# Patient Record
Sex: Female | Born: 1975 | Race: White | Hispanic: No | Marital: Married | Smoking: Current every day smoker
Health system: Southern US, Community
[De-identification: ages and names within clinical notes are randomized; demographics above are authoritative.]

## PROBLEM LIST (undated history)

## (undated) DIAGNOSIS — I10 Essential (primary) hypertension: Secondary | ICD-10-CM

## (undated) DIAGNOSIS — F419 Anxiety disorder, unspecified: Secondary | ICD-10-CM

## (undated) HISTORY — PX: NO PAST SURGERIES: SHX2092

---

## 2002-08-12 ENCOUNTER — Encounter: Admission: RE | Admit: 2002-08-12 | Discharge: 2002-09-11 | Payer: Self-pay | Admitting: Gynecology

## 2002-09-12 ENCOUNTER — Encounter: Admission: RE | Admit: 2002-09-12 | Discharge: 2002-10-12 | Payer: Self-pay | Admitting: Gynecology

## 2002-10-13 ENCOUNTER — Encounter: Admission: RE | Admit: 2002-10-13 | Discharge: 2002-11-12 | Payer: Self-pay | Admitting: Gynecology

## 2002-12-12 ENCOUNTER — Encounter: Admission: RE | Admit: 2002-12-12 | Discharge: 2003-01-11 | Payer: Self-pay | Admitting: Gynecology

## 2006-10-04 ENCOUNTER — Ambulatory Visit: Payer: Self-pay | Admitting: Obstetrics & Gynecology

## 2006-11-22 ENCOUNTER — Ambulatory Visit: Payer: Self-pay | Admitting: Obstetrics & Gynecology

## 2008-02-19 ENCOUNTER — Encounter (INDEPENDENT_AMBULATORY_CARE_PROVIDER_SITE_OTHER): Payer: Self-pay | Admitting: Gynecology

## 2008-02-19 ENCOUNTER — Ambulatory Visit: Payer: Self-pay | Admitting: Obstetrics and Gynecology

## 2011-01-18 NOTE — Assessment & Plan Note (Signed)
NAME:  Belinda Nelson, DALL NO.:  [id number]   MEDICAL RECORD NO.:  [id number]          PATIENT TYPE:  POB   LOCATION:  [facility] at [facility]         FACILITY:  [facility]   PHYSICIAN:  Argentina Donovan, MD        DATE OF BIRTH:  [birth year]   DATE OF SERVICE:  02/19/2008                                  CLINIC NOTE   HISTORY OF PRESENT ILLNESS:  The patient is a 35 year old Caucasian  female gravida 1, para 1-0-0-1, who has been on oral contraceptives for  the past 5 years and is considering getting pregnant.  She is in for her  annual examination and was counseled to wait 2 cycles after stopping the  pill and to start on her prenatal vitamin with folic acid prior to  conception.  The patient has had no physical complaints during the past  year.  She has been on oral contraceptive and lorazepam 0.5 mg b.i.d.  p.r.n. for history of anxiety.  She has no food allergies, but is  allergic to Cipro.   PAST SURGICAL HISTORY:  None.   PAST MEDICAL HISTORY:  None significantly except normal delivery.   FAMILY HISTORY:  Her grandmother, both of them had cancer along with  Hodgkin's disease.  She works part-time as a Associate Professor, and her  complaint last year of fatigue seems to have resolved as she thought  that was secondary to depression.   REVIEW OF SYSTEMS:  Negative with the exception of occasional need for  lorazepam.   PHYSICAL EXAMINATION:  VITAL SIGNS:  Blood pressure is 133/91, weight is  104 pounds, pulse is 94.  GENERAL:  The patient is a well-developed, well-nourished white female  in no acute distress.  HEENT:  Within normal limits.  NECK:  Supple.  Thyroid is symmetrical with no masses.  LUNGS:  Clear to auscultation and percussion.  HEART:  No murmur.  Normal sinus rhythm.  BREAST:  Symmetrical with no dominant masses.  No nipple discharge.  No  axillary, subclavicular or supraclavicular or submandibular lymph nodes  noted.  ABDOMEN:  Soft, flat, and nontender.   No masses, no organomegaly.  EXTREMITIES:  Normal with no edema.  No varices.  Her DTRs within normal  limits.  GENITAL:  External genitalia is normal with the exception of a small  nevus to the left of the labia majoris in the groin area.  BUS within  normal limits.  The vagina is clean and well rugated.  The cervix is  clean and parous.  Uterus is anterior, normal size, shape, consistency.  The adnexa is normal.  RECTAL:  No masses.   IMPRESSION:  Normal physical examination.  Borderline hypertension.  The  patient has recently had complete blood work done, which showed a  slightly elevated cholesterol level and she has been taking fish oil for  the past few months.           ______________________________  Argentina Donovan, MD     PR/MEDQ  D:  02/19/2008  T:  02/20/2008  Job:  [id number]

## 2011-07-10 ENCOUNTER — Ambulatory Visit: Payer: Self-pay | Admitting: Internal Medicine

## 2012-01-13 ENCOUNTER — Inpatient Hospital Stay: Payer: Self-pay

## 2012-01-13 LAB — PIH PROFILE
Anion Gap: 7 (ref 7–16)
Calcium, Total: 8.3 mg/dL — ABNORMAL LOW (ref 8.5–10.1)
Chloride: 105 mmol/L (ref 98–107)
Co2: 26 mmol/L (ref 21–32)
EGFR (African American): >60
EGFR (Non-African Amer.): >60
MCH: 30.9 pg (ref 26.0–34.0)
Osmolality: 277 (ref 275–301)
Platelet: 159 10*3/uL (ref 150–440)
Potassium: 3.2 mmol/L — ABNORMAL LOW (ref 3.5–5.1)
RBC: 3.52 10*6/uL — ABNORMAL LOW (ref 3.80–5.20)
RDW: 13.4 % (ref 11.5–14.5)
SGOT(AST): 19 U/L (ref 15–37)
Sodium: 138 mmol/L (ref 136–145)
Uric Acid: 2.9 mg/dL (ref 2.6–6.0)
WBC: 10.3 10*3/uL (ref 3.6–11.0)

## 2012-01-13 LAB — PROTEIN / CREATININE RATIO, URINE
Creatinine, Urine: 39.9 mg/dL (ref 30.0–125.0)
Protein, Random Urine: 6 mg/dL (ref 0–12)
Protein/Creat. Ratio: 150 mg/gCREAT (ref 0–200)

## 2012-03-04 ENCOUNTER — Observation Stay: Payer: Self-pay

## 2012-03-04 LAB — PIH PROFILE
Calcium, Total: 9.5 mg/dL (ref 8.5–10.1)
Co2: 27 mmol/L (ref 21–32)
Creatinine: 0.74 mg/dL (ref 0.60–1.30)
EGFR (Non-African Amer.): >60
Glucose: 90 mg/dL (ref 65–99)
HCT: 34.2 % — ABNORMAL LOW (ref 35.0–47.0)
HGB: 11.7 g/dL — ABNORMAL LOW (ref 12.0–16.0)
MCH: 31.1 pg (ref 26.0–34.0)
MCHC: 34.3 g/dL (ref 32.0–36.0)
MCV: 91 fL (ref 80–100)
Osmolality: 281 (ref 275–301)
RBC: 3.78 10*6/uL — ABNORMAL LOW (ref 3.80–5.20)
RDW: 13.3 % (ref 11.5–14.5)
SGOT(AST): 21 U/L (ref 15–37)

## 2012-03-04 LAB — PROTEIN / CREATININE RATIO, URINE
Creatinine, Urine: 14.7 mg/dL — ABNORMAL LOW (ref 30.0–125.0)
Protein, Random Urine: <5 mg/dL — ABNORMAL LOW (ref 0–12)

## 2012-03-14 ENCOUNTER — Observation Stay: Payer: Self-pay

## 2012-03-14 LAB — PIH PROFILE
Calcium, Total: 8.7 mg/dL (ref 8.5–10.1)
Creatinine: 0.73 mg/dL (ref 0.60–1.30)
EGFR (Non-African Amer.): >60
Glucose: 112 mg/dL — ABNORMAL HIGH (ref 65–99)
HCT: 33.9 % — ABNORMAL LOW (ref 35.0–47.0)
HGB: 11.4 g/dL — ABNORMAL LOW (ref 12.0–16.0)
MCV: 91 fL (ref 80–100)
Potassium: 3.1 mmol/L — ABNORMAL LOW (ref 3.5–5.1)
RDW: 13.4 % (ref 11.5–14.5)
SGOT(AST): 15 U/L (ref 15–37)
Sodium: 142 mmol/L (ref 136–145)
WBC: 10.1 10*3/uL (ref 3.6–11.0)

## 2012-03-14 LAB — PROTEIN / CREATININE RATIO, URINE: Protein, Random Urine: <5 mg/dL — ABNORMAL LOW (ref 0–12)

## 2012-04-03 ENCOUNTER — Observation Stay: Payer: Self-pay | Admitting: Obstetrics & Gynecology

## 2012-04-09 ENCOUNTER — Inpatient Hospital Stay: Payer: Self-pay

## 2012-04-09 LAB — PIH PROFILE
Anion Gap: 10 (ref 7–16)
BUN: 6 mg/dL — ABNORMAL LOW (ref 7–18)
Creatinine: 0.78 mg/dL (ref 0.60–1.30)
Glucose: 115 mg/dL — ABNORMAL HIGH (ref 65–99)
HCT: 33 % — ABNORMAL LOW (ref 35.0–47.0)
HGB: 11.4 g/dL — ABNORMAL LOW (ref 12.0–16.0)
MCV: 90 fL (ref 80–100)
Osmolality: 280 (ref 275–301)
Platelet: 137 10*3/uL — ABNORMAL LOW (ref 150–440)
Potassium: 2.9 mmol/L — ABNORMAL LOW (ref 3.5–5.1)
RBC: 3.69 10*6/uL — ABNORMAL LOW (ref 3.80–5.20)
WBC: 9.5 10*3/uL (ref 3.6–11.0)

## 2012-04-09 LAB — PROTEIN / CREATININE RATIO, URINE
Creatinine, Urine: 31.6 mg/dL (ref 30.0–125.0)
Protein, Random Urine: <5 mg/dL — ABNORMAL LOW (ref 0–12)

## 2012-04-10 LAB — PLATELET COUNT: Platelet: 138 10*3/uL — ABNORMAL LOW (ref 150–440)

## 2012-04-14 ENCOUNTER — Ambulatory Visit: Payer: Self-pay | Admitting: Pediatrics

## 2012-04-19 ENCOUNTER — Emergency Department: Payer: Self-pay | Admitting: Emergency Medicine

## 2012-04-19 LAB — CBC WITH DIFFERENTIAL/PLATELET
Eosinophil %: 1.9 %
HCT: 38.3 % (ref 35.0–47.0)
HGB: 13.4 g/dL (ref 12.0–16.0)
Monocyte %: 4.6 %
Neutrophil %: 64.4 %
RBC: 4.26 10*6/uL (ref 3.80–5.20)

## 2012-04-19 LAB — BASIC METABOLIC PANEL
Anion Gap: 8 (ref 7–16)
BUN: 14 mg/dL (ref 7–18)
EGFR (African American): >60
EGFR (Non-African Amer.): >60
Glucose: 96 mg/dL (ref 65–99)
Osmolality: 283 (ref 275–301)

## 2013-06-06 ENCOUNTER — Ambulatory Visit: Payer: Self-pay | Admitting: Family Medicine

## 2013-06-06 ENCOUNTER — Emergency Department: Payer: Self-pay | Admitting: Emergency Medicine

## 2013-06-06 LAB — COMPREHENSIVE METABOLIC PANEL
Albumin: 4.7 g/dL (ref 3.4–5.0)
Alkaline Phosphatase: 79 U/L (ref 50–136)
Anion Gap: 7 (ref 7–16)
Bilirubin,Total: 1.4 mg/dL — ABNORMAL HIGH (ref 0.2–1.0)
Calcium, Total: 9.7 mg/dL (ref 8.5–10.1)
Chloride: 98 mmol/L (ref 98–107)
Creatinine: 0.91 mg/dL (ref 0.60–1.30)
EGFR (African American): >60
EGFR (Non-African Amer.): >60
Glucose: 115 mg/dL — ABNORMAL HIGH (ref 65–99)
Potassium: 2.8 mmol/L — ABNORMAL LOW (ref 3.5–5.1)
SGOT(AST): 15 U/L (ref 15–37)
SGPT (ALT): 22 U/L (ref 12–78)
Total Protein: 8.4 g/dL — ABNORMAL HIGH (ref 6.4–8.2)

## 2013-06-06 LAB — URINALYSIS, COMPLETE
Glucose,UR: 50 mg/dL (ref 0–75)
Nitrite: NEGATIVE
Ph: 6 (ref 4.5–8.0)
Protein: NEGATIVE
RBC,UR: 1 /HPF (ref 0–5)
Specific Gravity: 1.008 (ref 1.003–1.030)
WBC UR: 16 /HPF (ref 0–5)

## 2013-06-06 LAB — CBC
HCT: 42.1 % (ref 35.0–47.0)
HGB: 15 g/dL (ref 12.0–16.0)
MCH: 30.3 pg (ref 26.0–34.0)
MCHC: 35.5 g/dL (ref 32.0–36.0)
MCV: 85 fL (ref 80–100)
RDW: 12 % (ref 11.5–14.5)
WBC: 9.8 10*3/uL (ref 3.6–11.0)

## 2013-06-06 LAB — TROPONIN I: Troponin-I: <0.02 ng/mL

## 2013-07-03 ENCOUNTER — Ambulatory Visit: Payer: Self-pay | Admitting: Gastroenterology

## 2015-01-13 NOTE — H&P (Signed)
L&D Evaluation:  History Expanded:   HPI 39 yo G2P1001 whoe EDC = 04/17/12.  [facility] with diastolic 5375f 106.  Pt's paswt Hx sig for PIH during labor with her first baby.    Patient's Medical History See above    Patient's Surgical History none    Medications Pre Natal Vitamins  Tylenol (Acetaminophen)  Nasonex, Xantac    Allergies Cipro - rash    Social History previous smoker   Exam:   Vital Signs BP >140/90  159/103, 134/82, 135/81, 120/80    General no apparent distress    Mental Status clear    Chest clear    Heart normal sinus rhythm    Abdomen gravid, non-tender    Estimated Fetal Weight Average for gestational age    Back no CVAT    Edema 1+    Reflexes 3+    FHT normal rate with no decels    Ucx absent   Impression:   Impression IUP, EDC = 04/17/12, Possible PIH   Plan:   Comments Admit for observation, PIH profile I have had along and careful discussion with this patietn about the potential sig of PIH at this early gestation adn all options.   Electronic Signatures: Towana Badgerosenow, Jaynie Hitch J (MD)  (Signed 10-May-13 15:01)  Authored: L&D Evaluation   Last Updated: 10-May-13 15:01 by Towana Badgerosenow, Deagen Krass J (MD)

## 2015-01-13 NOTE — H&P (Signed)
L&D Evaluation:  History Expanded:   HPI 39 yo G2P1001 with EDC = 04/17/12 by an 11 wk 1 day ultrasound presents at 35 1/7 weeks from office with elevated BP of 152/98 with negative protein urine dip. Pt reports mild headache this am, but believes it is d/t being unable to sleep last night.   +FM. No VB or LOF. Has CHTN and is currently on Labetalol 100 mg BID. PMHx sig for preeclampsia/low plt  during labor with her first baby in 2003. First child also had cleft lip and autism. Prenatal care begun in first trimester and was remarkable for AMA (Pt did not desire prenatal genetic testing).    Blood Type O positive    Group B Strep Results (Result >5wks must be treated as unknown) unknown/result > 5 weeks ago    Maternal HIV Negative    Maternal Syphilis Ab Nonreactive    Maternal Varicella Immune    Rubella Results immune    Patient's Medical History CHTN    Patient's Surgical History none    Medications Pre Natal Vitamins  Tylenol (Acetaminophen)  Labetalol 100 BID    Allergies Cipro - rash    Social History previous smoker   ROS:   ROS see HPI   Exam:   Vital Signs mostly 130s/70s, 154/89, 146/80, 140/88    Urine Protein negative dipstick, P/C ratio too low to calculate.    General no apparent distress    Mental Status clear    Chest clear    Heart no murmur/gallop/rubs    Abdomen gravid, non-tender, occasional braxton-hicks contractions    Estimated Fetal Weight Average for gestational age    Edema no edema  per pt and D. Jeralyn BennettPutnam, CNM - increased swelling this am    Reflexes 3+    Clonus negative    Mebranes Intact    FHT normal rate with no decels, baseline 130 to 150, moderate variability, + Accelerations, audbile fetal movement    Ucx mild irregular ctx initially, resolved with rest    Skin dry    Other Labs: 11.4 & 33.9, plt 147k, uric acid 3.7, SGOT 15, BUN 5, Cr 0.73, K+ 3.   Impression:   [facility] 35 1/7 weeks [facility]   Plan:    Comments Will increase Labetalol to 200 mg BID Pt will [facility] Pre-eclampsia precautions along with hypotension precautions with increased dose of Labetalol   Electronic Signatures: Vella KohlerBrothers, Honorio Devol K (CNM)  (Signed 10-Jul-13 14:11)  Authored: L&D Evaluation   Last Updated: 10-Jul-13 14:11 by Vella KohlerBrothers, Lilyanna Lunt K (CNM)

## 2015-01-13 NOTE — H&P (Signed)
L&D Evaluation:  History:   HPI 39 yo G2P1001 with EDC = 04/17/12 by an 11 wk 1 day ultrasound presents at 33 5/7 weeks with c/o blood pressure elevation accompanied by headache and visual disturbance all occuring with/after intercourse. BP 150-160/90 at home. Headache and visual changes have resolved. baby active. No VB or LOF. Has CHTN and is currently on Labetalol 100 mgm BID, although she only took one dose yesterday. Past Hx sig for preeclampsia/low plt  during labor with her first baby in 2003. First child also had cleft lip and autism. Prenatal care begun in first trimester and was remarkable for AMA (Pt did not desire prenatal genetic testing) and for [facility].    Presents with elevated BP    Patient's Medical History CHTN    Patient's Surgical History none    Medications Pre Natal Vitamins  Tylenol (Acetaminophen)  Labetalol 100 BID    Allergies Cipro - rash    Social History previous smoker   ROS:   ROS see HPI   Exam:   Vital Signs initial BPs 181/89, 174/95, 156/87, 154/91, 168/87. After another dose of labetalol, 100 mgm, BPs 130s to 140s/68-89. BP on discharge 116/71.    Urine Protein negative dipstick, P/C ratio too low to calculate.    General no apparent distress    Mental Status clear    Chest clear    Heart normal sinus rhythm, no murmur/gallop/rubs    Abdomen gravid, non-tender    Estimated Fetal Weight Average for gestational age    Edema no edema     Reflexes 2+     Mebranes Intact    FHT normal rate with no decels, 135-140 with accels to 150s    Fetal Heart Rate 140     Ucx mild irregular ctx initially, resolved with rest    Skin dry    Other PIH labs: 11.7&34.2, plt=153K, uric acid=3.3, SGOT=21, BUN=4, cr=.74, K+=3.0   Impression:   Impression [facility] 4/[facility] and an episode of elevated BPs, now resolved. No evidence of preeclampsia.   Plan:   Plan DC home with preeclampsia precautions. [facility]   Electronic Signatures: Trinna BalloonGutierrez, Tyquisha Sharps L (CNM)  (Signed 01-Jul-13 04:47)  Authored: L&D Evaluation   Last Updated: 01-Jul-13 04:47 by Trinna BalloonGutierrez, Nikiesha Milford L (CNM)

## 2015-01-13 NOTE — H&P (Signed)
L&D Evaluation:  History Expanded:   HPI 39 yo G2P1001 with EDC = 04/17/12 by an 11 wk 1 day ultrasound presents at 38 weeks with elevated BP at home. Pt reports mild headache this am.    +FM. No VB or LOF. Has CHTN and is currently on Labetalol 100 mg BID. PMHx sig for preeclampsia/low plt  during labor with her first baby in 2003. First child also had cleft lip and autism. Prenatal care begun in first trimester and was remarkable for AMA (Pt did not desire prenatal genetic testing). Denies change in edema, vision, or CP/SOB/epig pain.    Blood Type (Maternal) O positive    Group B Strep Results Maternal (Result >5wks must be treated as unknown) unknown/result > 5 weeks ago    Maternal HIV Negative    Maternal Syphilis Ab Nonreactive    Maternal Varicella Immune    Rubella Results (Maternal) immune    Patient's Medical History CHTN    Patient's Surgical History none    Medications Pre Natal Vitamins  Tylenol (Acetaminophen)  Labetalol 100 BID    Allergies Cipro - rash    Social History previous smoker    Family History Non-Contributory   ROS:   ROS see HPI   Exam:   Vital Signs 110-120s/60-70s.    Urine Protein negative dipstick, P/C ratio too low to calculate.    General no apparent distress    Mental Status clear    Chest clear    Heart no murmur/gallop/rubs    Abdomen gravid, non-tender    Estimated Fetal Weight Average for gestational age    Back no CVAT    Edema no edema    Reflexes 3+    Clonus negative    FHT normal rate with no decels, baseline 130 to 150, moderate variability, + Accelerations, audbile fetal movement    Ucx absent    Skin dry   Impression:   Impression IUP at 38 weeks with CHTN, no signs of preclampsia   Plan:   Comments Pre-eclampsia precautions along with hypotension precautions on meds. Patient is counseled at length about the risks and consequences of Pregnancy Induced Hypertension, especially as it pertains to the  development of preclampsia.  Risks of preclampsia include maternal and fetal complications, usually necessitating active delivery planning.  Physical exam findings, fetal heart rate monitoring, and laboratory findings are utilized together to determine the presence and severity of any gestational hypertension disorder.  Further management will be based on these findings.  Continued bed rest and frequent follow-up for blood pressure checks and symptom evaluation is of utmost importance for the remainder of pregnancy.   Electronic Signatures: Letitia LibraHarris, Tanyah Debruyne Paul (MD)  (Signed 30-Jul-13 16:22)  Authored: L&D Evaluation   Last Updated: 30-Jul-13 16:22 by Letitia LibraHarris, Kaisei Gilbo Paul (MD)

## 2015-04-10 ENCOUNTER — Ambulatory Visit
Admission: EM | Admit: 2015-04-10 | Discharge: 2015-04-10 | Disposition: A | Discharge status: Home / Self Care | Payer: Commercial Indemnity | Attending: Family Medicine | Admitting: Family Medicine

## 2015-04-10 DIAGNOSIS — J40 Bronchitis, not specified as acute or chronic: Secondary | ICD-10-CM | POA: Diagnosis not present

## 2015-04-10 HISTORY — DX: Essential (primary) hypertension: I10

## 2015-04-10 MED ORDER — AZITHROMYCIN 250 MG PO TABS: ORAL_TABLET | ORAL | Status: DC

## 2015-04-10 NOTE — ED Provider Notes (Signed)
CSN: [id number]     Arrival date & time 04/10/15  1204 History   First MD Initiated Contact with Patient 04/10/15 1224     Chief Complaint  Patient presents with  . URI   (Consider location/radiation/quality/duration/timing/severity/associated sxs/prior Treatment) Patient is a 39 y.o. female presenting with URI. The history is provided by the patient.  URI Presenting symptoms: congestion, cough, fatigue and fever   Presenting symptoms: no ear pain, no facial pain, no rhinorrhea and no sore throat   Severity:  Moderate Onset quality:  Sudden Timing:  Constant Progression:  Worsening Chronicity:  New Risk factors: chronic respiratory disease (unknown; chronic smoker) and sick contacts (husband recently diagnosed with pneumonia)     Past Medical History  Diagnosis Date  . Hypertension    History reviewed. No pertinent past surgical history. No family history on file. History  Substance Use Topics  . Smoking status: Current Every Day Smoker  . Smokeless tobacco: Not on file  . Alcohol Use: Yes   OB History    No data available     Review of Systems  Constitutional: Positive for fever and fatigue.  HENT: Positive for congestion. Negative for ear pain, rhinorrhea and sore throat.   Respiratory: Positive for cough.     Allergies  Ciprofloxacin  Home Medications   Prior to Admission medications   Medication Sig Start Date End Date Taking? Authorizing Provider  escitalopram (LEXAPRO) 10 MG tablet Take 10 mg by mouth daily.   Yes Historical Provider, MD  lisinopril (PRINIVIL,ZESTRIL) 10 MG tablet Take 10 mg by mouth daily.   Yes Historical Provider, MD  norethindrone-ethinyl estradiol-iron (ESTROSTEP FE,TILIA FE,TRI-LEGEST FE) 1-20/1-30/1-35 MG-MCG tablet Take 1 tablet by mouth daily.   Yes Historical Provider, MD  pantoprazole (PROTONIX) 20 MG tablet Take 20 mg by mouth daily.   Yes Historical Provider, MD  azithromycin (ZITHROMAX Z-PAK) 250 MG tablet 2 tabs po once day 1,  then 1 tab po qd for next 4 days 04/10/15   Payton Mccallum, MD   BP 139/98 mmHg  Pulse 100  Temp(Src) 98.4 F (36.9 C) (Tympanic)  Resp 16  Ht  (1.6 m)  Wt 105 lb (47.628 kg)  BMI 18.60 kg/m2  SpO2 100%  LMP 03/27/2015 Physical Exam  Constitutional: She appears well-developed and well-nourished. No distress.  HENT:  Head: Normocephalic.  Right Ear: Tympanic membrane, external ear and ear canal normal.  Left Ear: Tympanic membrane, external ear and ear canal normal.  Nose: Nose normal.  Mouth/Throat: Uvula is midline, oropharynx is clear and moist and mucous membranes are normal. No oropharyngeal exudate, posterior oropharyngeal edema or posterior oropharyngeal erythema.  Eyes: Conjunctivae and EOM are normal. Pupils are equal, round, and reactive to light. Right eye exhibits no discharge. Left eye exhibits no discharge. No scleral icterus.  Neck: Normal range of motion. Neck supple. No JVD present. No tracheal deviation present. No thyromegaly present.  Cardiovascular: Normal rate, regular rhythm, normal heart sounds and intact distal pulses.   No murmur heard. Pulmonary/Chest: Effort normal. No stridor. No respiratory distress.  Diffuse rhonchi  Lymphadenopathy:    She has no cervical adenopathy.  Neurological: She is alert.  Skin: She is not diaphoretic.  Vitals reviewed.   ED Course  Procedures (including critical care time) Labs Review Labs Reviewed - No data to display  Imaging Review No results found.   MDM   1. Bronchitis    Discharge Medication List as of 04/10/2015  1:14 PM    START  taking these medications   Details  azithromycin (ZITHROMAX Z-PAK) 250 MG tablet 2 tabs po once day 1, then 1 tab po qd for next 4 days, Normal      Plan: 1.  diagnosis reviewed with patient 2. rx as per orders; risks, benefits, potential side effects reviewed with patient 3. Recommend supportive treatment with otc cough medication prn; recommend smoking cessation 4. F/u  prn if symptoms worsen or don't improve    Payton Mccallum, MD 04/10/15 [phone]

## 2015-04-10 NOTE — ED Notes (Signed)
Pt states "I have had cold symptoms two months. I do feel some better but my husband was diagnosed with pneumonia this morning and I'm worried I may have it."

## 2017-10-06 ENCOUNTER — Ambulatory Visit
Admission: EM | Admit: 2017-10-06 | Discharge: 2017-10-06 | Disposition: A | Discharge status: Home / Self Care | Payer: Managed Care, Other (non HMO) | Attending: Family Medicine | Admitting: Family Medicine

## 2017-10-06 ENCOUNTER — Encounter: Payer: Self-pay | Admitting: *Deleted

## 2017-10-06 DIAGNOSIS — R112 Nausea with vomiting, unspecified: Secondary | ICD-10-CM

## 2017-10-06 DIAGNOSIS — E860 Dehydration: Secondary | ICD-10-CM

## 2017-10-06 DIAGNOSIS — R197 Diarrhea, unspecified: Secondary | ICD-10-CM | POA: Diagnosis not present

## 2017-10-06 LAB — COMPREHENSIVE METABOLIC PANEL
ALT: 15 U/L (ref 14–54)
AST: 21 U/L (ref 15–41)
Albumin: 5 g/dL (ref 3.5–5.0)
Alkaline Phosphatase: 54 U/L (ref 38–126)
Anion gap: 11 (ref 5–15)
BUN: 26 mg/dL — ABNORMAL HIGH (ref 6–20)
CO2: 22 mmol/L (ref 22–32)
Calcium: 9.5 mg/dL (ref 8.9–10.3)
Chloride: 103 mmol/L (ref 101–111)
Creatinine, Ser: 1.6 mg/dL — ABNORMAL HIGH (ref 0.44–1.00)
GFR calc Af Amer: 46 mL/min — ABNORMAL LOW (ref >60)
GFR calc non Af Amer: 39 mL/min — ABNORMAL LOW (ref >60)
Glucose, Bld: 138 mg/dL — ABNORMAL HIGH (ref 65–99)
Potassium: 4.1 mmol/L (ref 3.5–5.1)
Sodium: 136 mmol/L (ref 135–145)
Total Bilirubin: 0.6 mg/dL (ref 0.3–1.2)
Total Protein: 8.7 g/dL — ABNORMAL HIGH (ref 6.5–8.1)

## 2017-10-06 LAB — CBC WITH DIFFERENTIAL/PLATELET
Basophils Absolute: 0.1 10*3/uL (ref 0–0.1)
Basophils Relative: 1 %
Eosinophils Absolute: 0 10*3/uL (ref 0–0.7)
Eosinophils Relative: 0 %
HCT: 47.7 % — ABNORMAL HIGH (ref 35.0–47.0)
Hemoglobin: 16.5 g/dL — ABNORMAL HIGH (ref 12.0–16.0)
Lymphocytes Relative: 2 %
Lymphs Abs: 0.3 10*3/uL — ABNORMAL LOW (ref 1.0–3.6)
MCH: 32.6 pg (ref 26.0–34.0)
MCHC: 34.7 g/dL (ref 32.0–36.0)
MCV: 94 fL (ref 80.0–100.0)
Monocytes Absolute: 0.4 10*3/uL (ref 0.2–0.9)
Monocytes Relative: 2 %
Neutro Abs: 15.4 10*3/uL — ABNORMAL HIGH (ref 1.4–6.5)
Neutrophils Relative %: 95 %
Platelets: 246 10*3/uL (ref 150–440)
RBC: 5.07 MIL/uL (ref 3.80–5.20)
RDW: 12.8 % (ref 11.5–14.5)
WBC: 16.2 10*3/uL — ABNORMAL HIGH (ref 3.6–11.0)

## 2017-10-06 MED ORDER — ONDANSETRON 8 MG PO TBDP: 8.0000 mg | ORAL_TABLET | Freq: Two times a day (BID) | ORAL | 0 refills | Status: AC

## 2017-10-06 MED ORDER — ONDANSETRON 8 MG PO TBDP
8.0000 mg | ORAL_TABLET | Freq: Once | ORAL | Status: AC
  Administered 2017-10-06: 8 mg via ORAL

## 2017-10-06 MED ORDER — SODIUM CHLORIDE 0.9 % IV BOLUS (SEPSIS)
1000.0000 mL | Freq: Once | INTRAVENOUS | Status: AC
  Administered 2017-10-06: 1000 mL via INTRAVENOUS

## 2017-10-06 NOTE — Medical Student Note (Addendum)
[facility] Provider Student Note For educational purposes for Medical, PA and NP students only and not part of the legal medical record.   CSN: [id number] Arrival date & time: 10/06/17  0917     History   Chief Complaint Chief Complaint  Patient presents with  . Emesis  . Nausea  . Diarrhea  . Chills    HPI Belinda Nelson is a 42 y.o. female.  HPI  42 year old white female presents with sudden onset nausea, vomiting, watery diarrhea, generalized weakness, fatigue around 4 am this morning. Took Pepto Bismol with mild relief. Denies fever/chills, rectal bleeding, hemoptysis, abdominal pain, visual changes, dizziness, syncope, chest pain, shortness of breath, unexpected weight loss. History of hypertension on Lisinopril (did not take a dose this AM), alcoholism of 8-10 beers/night for many years (quit altogether 2 weeks ago, attending AA meetings). Denies any signs of alcohol withdrawal including tremors, visual hallucinations, palpitations. Ate pasta with parmesan cheese in it that was cooked last night but states she left it out for 2 hours and reheated it before eating. Other family members ate the same pasta without any issues. Husband and son had the flu in the last couple weeks. Follows up with a PCP and has had no recent changes in medications. Tolerating PO fluids well, has not tried to eat anything this morning.   Past Medical History:  Diagnosis Date  . Hypertension     There are no active problems to display for this patient.   History reviewed. No pertinent surgical history.  OB History    No data available       Home Medications    Prior to Admission medications   Medication Sig Start Date End Date Taking? Authorizing Provider  escitalopram (LEXAPRO) 10 MG tablet Take 10 mg by mouth daily.   Yes [provider]  lisinopril (PRINIVIL,ZESTRIL) 10 MG tablet Take 10 mg by mouth daily.   Yes [provider]  pantoprazole (PROTONIX)  20 MG tablet Take 20 mg by mouth daily.   Yes [provider]  norethindrone-ethinyl estradiol-iron (ESTROSTEP FE,TILIA FE,TRI-LEGEST FE) 1-20/1-30/1-35 MG-MCG tablet Take 1 tablet by mouth daily.    [provider]  ondansetron (ZOFRAN ODT) 8 MG disintegrating tablet Take 1 tablet (8 mg total) by mouth 2 (two) times daily. 10/06/17   Lutricia Feil, PA-C    Family History Family History  Problem Relation Age of Onset  . Healthy Mother   . Alcoholism Father     Social History Social History   Tobacco Use  . Smoking status: Current Every Day Smoker  . Smokeless tobacco: Never Used  Substance Use Topics  . Alcohol use: Yes  . Drug use: No     Allergies   Ciprofloxacin   Review of Systems Review of Systems  Constitutional: Positive for appetite change and fatigue. Negative for chills, diaphoresis, fever and unexpected weight change.  HENT: Negative for congestion, ear pain, hearing loss and sinus pressure.   Eyes: Negative for visual disturbance.  Respiratory: Negative for cough and shortness of breath.   Cardiovascular: Negative for chest pain and palpitations.  Gastrointestinal: Positive for diarrhea, nausea and vomiting. Negative for abdominal pain, anal bleeding, blood in stool, constipation and rectal pain.  Genitourinary: Negative for dysuria, frequency, hematuria and urgency.  Musculoskeletal: Positive for neck pain. Negative for arthralgias and myalgias.  Skin: Negative for rash.  Neurological: Positive for weakness. Negative for dizziness, syncope, light-headedness, numbness and headaches.  Physical Exam Updated Vital Signs BP (!) 78/50 (BP Location: Left Arm)   Pulse (!) 105   Temp (!) 97.5 F (36.4 C) (Oral)   Resp 16   Ht 5' (1.524 m)   Wt 47.6 kg   SpO2 100%   BMI 20.51 kg/m   Physical Exam  Constitutional: She is oriented to person, place, and time. She appears well-developed. No distress.  Appears older than stated age and  slightly emaciated.  Non-toxic appearing.   HENT:  Head: Normocephalic and atraumatic.  Dry mucous membranes   Eyes: Conjunctivae and EOM are normal. Pupils are equal, round, and reactive to light.  Neck: Normal range of motion. Neck supple.  Cardiovascular: Normal rate, regular rhythm and intact distal pulses.  No murmur heard. Pulmonary/Chest: Effort normal and breath sounds normal. No respiratory distress. She has no wheezes. She exhibits no tenderness.  Abdominal: Soft. She exhibits no distension and no ascites. Bowel sounds are decreased. There is no tenderness. There is no rigidity, no rebound, no guarding, no tenderness at McBurney's point and negative Murphy's sign.  Musculoskeletal: She exhibits no edema.  5/5 strength in bilateral upper and lower extremities  Neurological: She is alert and oriented to person, place, and time.  Skin: Skin is warm and dry. Capillary refill takes less than 2 seconds.  Slightly decreased skin turgor.  Psychiatric: She has a normal mood and affect.  Nursing note and vitals reviewed.    ED Treatments / Results  Labs (all labs ordered are listed, but only abnormal results are displayed) Labs Reviewed  CBC WITH DIFFERENTIAL/PLATELET - Abnormal; Notable for the following components:      Result Value   WBC 16.2 (*)    Hemoglobin 16.5 (*)    HCT 47.7 (*)    Neutro Abs 15.4 (*)    Lymphs Abs 0.3 (*)    All other components within normal limits  COMPREHENSIVE METABOLIC PANEL - Abnormal; Notable for the following components:   Glucose, Bld 138 (*)    BUN 26 (*)    Creatinine, Ser 1.60 (*)    Total Protein 8.7 (*)    GFR calc non Af Amer 39 (*)    GFR calc Af Amer 46 (*)    All other components within normal limits    EKG  EKG Interpretation None       Radiology No results found.  Procedures Procedures (including critical care time)  Medications Ordered in ED Medications  ondansetron (ZOFRAN-ODT) disintegrating tablet 8 mg (8 mg  Oral Given 10/06/17 1224)  sodium chloride 0.9 % bolus 1,000 mL (0 mLs Intravenous Stopped 10/06/17 1338)     Initial Impression / Assessment and Plan / ED Course  I have reviewed the triage vital signs and the nursing notes.  Pertinent labs & imaging results that were available during my care of the patient were reviewed by me and considered in my medical decision making (see chart for details).    Gastroenteritis, likely viral, with acute kidney injury secondary to dehydration 42 year old white female with history of alcoholism and hypertension presents with improving sudden onset nausea/vomiting, watery diarrhea, fatigue since 4 am this morning. She appears non-toxic and abdominal exam was benign, lowering suspicion for pancreatitis. She was hypotensive at 78/50 and tachycardic at 105 bpm upon arrival. Given Zofran, PO water, 1L NS bolus, Pedialyte in clinic that she tolerated well. She was orthostatic prior to IVF. Blood pressure and HR improved significantly after the bolus. CMP today shows elevated  BUN and Cr and decreased GFR consistent with dehydration, preserved liver functioning. Compared to 2014 CMP that showed preserved kidney functioning. CBC today shows leukocytosis with left shift and elevated Hgb/Hct, consistent with acute infection.  She does not appear to be in alcohol withdrawal. Encouraged to continue clear fluids, prescribed Zofran. Resume hypertension medication tomorrow morning. Monitor for improvement and follow up with PCP or return to clinic if symptoms persist or worsen.   Final Clinical Impressions(s) / ED Diagnoses   Final diagnoses:  Dehydration  Nausea vomiting and diarrhea    New Prescriptions Discharge Medication List as of 10/06/2017  1:38 PM    START taking these medications   Details  ondansetron (ZOFRAN ODT) 8 MG disintegrating tablet Take 1 tablet (8 mg total) by mouth 2 (two) times daily., Starting Fri 10/06/2017, Normal

## 2017-10-06 NOTE — ED Provider Notes (Signed)
[facility]    CSN: [id number] Arrival date & time: 10/06/17  0917     History   Chief Complaint Chief Complaint  Patient presents with  . Emesis  . Nausea  . Diarrhea  . Chills    HPI Kendalynn Wideman is a 42 y.o. female.   HPI  42 year old female recovering alcoholic presents with the sudden onset prior at 4:00 this morning of nausea vomiting and diarrhea.  She has had chills and weakness.  She has had approximately 6 episodes of watery diarrhea vomitus with no coffee-ground material.  Her last bowel movement was this morning around 9:00 in the last vomiting was in our waiting room prior.  Resident time she does not look toxic she has no nausea has no abdominal pain.  Her only abdominal pain is been cramping with the diarrhea.  Last night she went to an AA meeting; family ate pasta and Parmesan cheese but left it out and when she returned home some 2 hours later microwaved food and ate it. States none of her other family members were sick.         Past Medical History:  Diagnosis Date  . Hypertension     There are no active problems to display for this patient.   History reviewed. No pertinent surgical history.  OB History    No data available       Home Medications    Prior to Admission medications   Medication Sig Start Date End Date Taking? Authorizing Provider  escitalopram (LEXAPRO) 10 MG tablet Take 10 mg by mouth daily.   Yes [provider]  lisinopril (PRINIVIL,ZESTRIL) 10 MG tablet Take 10 mg by mouth daily.   Yes [provider]  pantoprazole (PROTONIX) 20 MG tablet Take 20 mg by mouth daily.   Yes [provider]  norethindrone-ethinyl estradiol-iron (ESTROSTEP FE,TILIA FE,TRI-LEGEST FE) 1-20/1-30/1-35 MG-MCG tablet Take 1 tablet by mouth daily.    [provider]  ondansetron (ZOFRAN ODT) 8 MG disintegrating tablet Take 1 tablet (8 mg total) by mouth 2 (two) times daily. 10/06/17   Lutricia Feil,  PA-C    Family History Family History  Problem Relation Age of Onset  . Healthy Mother   . Alcoholism Father     Social History Social History   Tobacco Use  . Smoking status: Current Every Day Smoker  . Smokeless tobacco: Never Used  Substance Use Topics  . Alcohol use: Yes  . Drug use: No     Allergies   Ciprofloxacin   Review of Systems Review of Systems  Constitutional: Positive for activity change and appetite change. Negative for chills, fatigue and fever.  Gastrointestinal: Positive for diarrhea, nausea and vomiting.  All other systems reviewed and are negative.    Physical Exam Triage Vital Signs ED Triage Vitals  Enc Vitals Group     BP 10/06/17 1018 (!) 78/50     Pulse Rate 10/06/17 1018 (!) 105     Resp 10/06/17 1018 16     Temp 10/06/17 1018 (!) 97.5 F (36.4 C)     Temp Source 10/06/17 1018 Oral     SpO2 10/06/17 1018 100 %     Weight 10/06/17 1021 105 lb (47.6 kg)     Height 10/06/17 1021 5' (1.524 m)     Head Circumference --      Peak Flow --      Pain Score --      Pain Loc --  Pain Edu? --      Excl. in GC? --    Orthostatic VS for the past 24 hrs:  BP- Lying Pulse- Lying BP- Sitting Pulse- Sitting BP- Standing at 0 minutes Pulse- Standing at 0 minutes  10/06/17 1326 105/64 86 96/63 102 101/64 111  10/06/17 1149 90/61 97 95/64 101 (!) 72/49 125    Updated Vital Signs BP (!) 78/50 (BP Location: Left Arm)   Pulse (!) 105   Temp (!) 97.5 F (36.4 C) (Oral)   Resp 16   Ht 5' (1.524 m)   Wt 105 lb (47.6 kg)   SpO2 100%   BMI 20.51 kg/m   Visual Acuity Right Eye Distance:   Left Eye Distance:   Bilateral Distance:    Right Eye Near:   Left Eye Near:    Bilateral Near:     Physical Exam  Constitutional: She is oriented to person, place, and time. She appears well-developed and well-nourished. No distress.  HENT:  Head: Normocephalic.  Mucous membranes are mildly dry.  Turgor is slow  Eyes: Pupils are equal, round,  and reactive to light. Right eye exhibits no discharge. Left eye exhibits no discharge.  Neck: Normal range of motion.  Pulmonary/Chest: Effort normal and breath sounds normal.  Abdominal: Soft. Bowel sounds are normal. She exhibits no distension and no mass. There is no tenderness. There is no rebound and no guarding.  Musculoskeletal: Normal range of motion.  Neurological: She is alert and oriented to person, place, and time.  Skin: Skin is warm and dry. She is not diaphoretic.  Psychiatric: She has a normal mood and affect. Her behavior is normal. Judgment and thought content normal.  Nursing note and vitals reviewed.    UC Treatments / Results  Labs (all labs ordered are listed, but only abnormal results are displayed) Labs Reviewed  CBC WITH DIFFERENTIAL/PLATELET - Abnormal; Notable for the following components:      Result Value   WBC 16.2 (*)    Hemoglobin 16.5 (*)    HCT 47.7 (*)    Neutro Abs 15.4 (*)    Lymphs Abs 0.3 (*)    All other components within normal limits  COMPREHENSIVE METABOLIC PANEL - Abnormal; Notable for the following components:   Glucose, Bld 138 (*)    BUN 26 (*)    Creatinine, Ser 1.60 (*)    Total Protein 8.7 (*)    GFR calc non Af Amer 39 (*)    GFR calc Af Amer 46 (*)    All other components within normal limits    EKG  EKG Interpretation None       Radiology No results found.  Procedures Procedures (including critical care time)  Medications Ordered in UC Medications  ondansetron (ZOFRAN-ODT) disintegrating tablet 8 mg (8 mg Oral Given 10/06/17 1224)  sodium chloride 0.9 % bolus 1,000 mL (0 mLs Intravenous Stopped 10/06/17 1338)     Initial Impression / Assessment and Plan / UC Course  I have reviewed the triage vital signs and the nursing notes.  Pertinent labs & imaging results that were available during my care of the patient were reviewed by me and considered in my medical decision making (see chart for details).      Plan: 1. Test/x-ray results and diagnosis reviewed with patient 2. rx as per orders; risks, benefits, potential side effects reviewed with patient 3. Recommend supportive treatment with hydration.  Provided her with Zofran for nausea and vomiting to assist her with  the rehydration.  She Felt 70% improved after the bolus of normal saline..  She felt better and her orthostatics were improved as well.  She states that her white count has been elevated at her last primary care visit and if it remained elevated they would do a workup.  I have advised her that she should follow-up with her primary care in 1 week to ensure that her kidney function has improved and to evaluate the white count if it remains elevated.  She is not improving or worsens I have recommended that she go immediately to the emergency room.  We will hold off on taking her blood pressure medicine tonight. 4. F/u prn if symptoms worsen or don't improve   Final Clinical Impressions(s) / UC Diagnoses   Final diagnoses:  Dehydration  Nausea vomiting and diarrhea    ED Discharge Orders        Ordered    ondansetron (ZOFRAN ODT) 8 MG disintegrating tablet  2 times daily     10/06/17 1332       Controlled Substance Prescriptions [place] Controlled Substance Registry consulted? Not Applicable   Lutricia FeilRoemer, Ebonie Westerlund P, PA-C 10/06/17 1354

## 2017-10-06 NOTE — ED Triage Notes (Signed)
N/V/D, onset during night last night. Also, chills and weakness.

## 2018-02-18 ENCOUNTER — Ambulatory Visit
Admission: EM | Admit: 2018-02-18 | Discharge: 2018-02-18 | Disposition: A | Discharge status: Home / Self Care | Payer: Managed Care, Other (non HMO) | Attending: Internal Medicine | Admitting: Internal Medicine

## 2018-02-18 ENCOUNTER — Encounter: Payer: Self-pay | Admitting: Gynecology

## 2018-02-18 ENCOUNTER — Other Ambulatory Visit: Payer: Self-pay

## 2018-02-18 DIAGNOSIS — J069 Acute upper respiratory infection, unspecified: Secondary | ICD-10-CM | POA: Diagnosis not present

## 2018-02-18 HISTORY — DX: Anxiety disorder, unspecified: F41.9

## 2018-02-18 MED ORDER — AZITHROMYCIN 250 MG PO TABS: ORAL_TABLET | ORAL | 0 refills | Status: DC

## 2018-02-18 MED ORDER — METHYLPREDNISOLONE 4 MG PO TBPK: ORAL_TABLET | ORAL | 0 refills | Status: DC

## 2018-02-18 NOTE — ED Triage Notes (Signed)
Per patient with left ear pressure/ facial pressure / cough /chest congestion.

## 2018-02-18 NOTE — ED Provider Notes (Signed)
[facility]    CSN: [id number] Arrival date & time: 02/18/18  0950     History   Chief Complaint Chief Complaint  Patient presents with  . Appointment  . URI    HPI Belinda Nelson is a 42 y.o. female who presents today for evaluation of 7-day history of cough, sinus pressure and congestion.  Symptoms started last Sunday, she noticed a irritated throat, son recently diagnosed with strep throat.  This then transitioned into facial pressure and ear pressure ongoing for several days however over the last 72 hours she reports an increase in chest tightness and congestion.  The patient is a 1 pack/day smoker.  Denies any fevers at home.  She does report a productive cough.  No bloody sputum.  No vision changes or hearing changes.  She is taking allergy medication at home, Benadryl and Nasonex.  Does report chills and aching sensation while at home.  HPI  Past Medical History:  Diagnosis Date  . Anxiety   . Hypertension     There are no active problems to display for this patient.   History reviewed. No pertinent surgical history.  OB History   None      Home Medications    Prior to Admission medications   Medication Sig Start Date End Date Taking? Authorizing Provider  escitalopram (LEXAPRO) 10 MG tablet Take 10 mg by mouth daily.   Yes [provider]  lisinopril (PRINIVIL,ZESTRIL) 10 MG tablet Take 10 mg by mouth daily.   Yes [provider]  mometasone (NASONEX) 50 MCG/ACT nasal spray Place into the nose. 11/27/17 11/27/18 Yes [provider]  norethindrone-ethinyl estradiol-iron (ESTROSTEP FE,TILIA FE,TRI-LEGEST FE) 1-20/1-30/1-35 MG-MCG tablet Take 1 tablet by mouth daily.   Yes [provider]  azithromycin (ZITHROMAX Z-PAK) 250 MG tablet Take per package instructions. 02/18/18   Anson OregonMcGhee, James Lance, PA-C  methylPREDNISolone (MEDROL DOSEPAK) 4 MG TBPK tablet Take per package instructions. 02/18/18   Anson OregonMcGhee, James Lance, PA-C   ondansetron (ZOFRAN ODT) 8 MG disintegrating tablet Take 1 tablet (8 mg total) by mouth 2 (two) times daily. 10/06/17   Lutricia Feiloemer, William P, PA-C  pantoprazole (PROTONIX) 20 MG tablet Take 20 mg by mouth daily.    [provider]    Family History Family History  Problem Relation Age of Onset  . Healthy Mother   . Alcoholism Father     Social History Social History   Tobacco Use  . Smoking status: Current Every Day Smoker  . Smokeless tobacco: Never Used  Substance Use Topics  . Alcohol use: Yes  . Drug use: No     Allergies   Ciprofloxacin   Review of Systems Review of Systems  Constitutional: Negative for activity change and chills.  HENT: Positive for congestion, ear pain, sinus pressure and sinus pain. Negative for sore throat.   Eyes: Negative.   Respiratory: Positive for cough, chest tightness and wheezing.   Cardiovascular: Negative.   Gastrointestinal: Negative.   Endocrine: Negative.   Genitourinary: Negative.   Musculoskeletal: Negative.   Skin: Negative.   Allergic/Immunologic: Negative.   Neurological: Negative.   Hematological: Negative.   Psychiatric/Behavioral: Negative.      Physical Exam Triage Vital Signs ED Triage Vitals  Enc Vitals Group     BP 02/18/18 0956 109/73     Pulse Rate 02/18/18 0956 95     Resp 02/18/18 0956 16     Temp 02/18/18 0956 98.3 F (36.8 C)     Temp  Source 02/18/18 0956 Oral     SpO2 02/18/18 0956 100 %     Weight 02/18/18 0957 110 lb (49.9 kg)     Height 02/18/18 0957 5\' 2"  (1.575 m)     Head Circumference --      Peak Flow --      Pain Score 02/18/18 0957 2     Pain Loc --      Pain Edu? --      Excl. in GC? --    No data found.  Updated Vital Signs BP 109/73 (BP Location: Left Arm)   Pulse 95   Temp 98.3 F (36.8 C) (Oral)   Resp 16   Ht 5\' 2"  (1.575 m)   Wt 110 lb (49.9 kg)   LMP 01/18/2018   SpO2 100%   BMI 20.12 kg/m   Visual Acuity Right Eye Distance:   Left Eye Distance:     Bilateral Distance:    Right Eye Near:   Left Eye Near:    Bilateral Near:     Physical Exam  Constitutional: She appears well-developed and well-nourished.  HENT:  Head: Normocephalic.  Right Ear: Tympanic membrane is bulging.  Left Ear: Tympanic membrane is bulging.  Nose: Right sinus exhibits no maxillary sinus tenderness and no frontal sinus tenderness. Left sinus exhibits no maxillary sinus tenderness and no frontal sinus tenderness.  Mouth/Throat: Uvula is midline, oropharynx is clear and moist and mucous membranes are normal. No oropharyngeal exudate.  Mild erythema to bilateral nasal canals  Cardiovascular: Regular rhythm, S1 normal, S2 normal, normal heart sounds and normal pulses.  Pulmonary/Chest: Effort normal. She has wheezes in the right upper field and the left upper field.   UC Treatments / Results  Labs (all labs ordered are listed, but only abnormal results are displayed) Labs Reviewed - No data to display  EKG None  Radiology No results found.  Procedures Procedures (including critical care time)  Medications Ordered in UC Medications - No data to display  Initial Impression / Assessment and Plan / UC Course  I have reviewed the triage vital signs and the nursing notes.  Pertinent labs & imaging results that were available during my care of the patient were reviewed by me and considered in my medical decision making (see chart for details).     1.  Treatment options were discussed today with the patient. 2.  The patient was encouraged to decrease and eventually quit smoking.   3.  As symptoms have been ongoing for 1 week and the patient is a smoker, she was prescribed a Z-Pak in addition to a 6-day prednisone taper. 4.  If worsening symptoms will return back to urgent care or follow-up with PCP. Final Clinical Impressions(s) / UC Diagnoses   Final diagnoses:  Upper respiratory tract infection, unspecified type     Discharge Instructions      -Take medications as directed. -Begin taking daily Claritin or zyrtec. -Continue working on smoking cessation.   ED Prescriptions    Medication Sig Dispense Auth. Provider   methylPREDNISolone (MEDROL DOSEPAK) 4 MG TBPK tablet Take per package instructions. 21 tablet Blanchard Mane Lance, PA-C   azithromycin (ZITHROMAX Z-PAK) 250 MG tablet Take per package instructions. 6 tablet Anson Oregon, PA-C     Controlled Substance Prescriptions [place] Controlled Substance Registry consulted? Not Applicable   Anson Oregon, PA-C 02/18/18 1037

## 2018-02-18 NOTE — Discharge Instructions (Addendum)
-  Take medications as directed. -Begin taking daily Claritin or zyrtec. -Continue working on smoking cessation.

## 2018-10-08 ENCOUNTER — Other Ambulatory Visit: Payer: Self-pay

## 2018-10-08 ENCOUNTER — Encounter: Payer: Self-pay | Admitting: Emergency Medicine

## 2018-10-08 ENCOUNTER — Ambulatory Visit
Admission: EM | Admit: 2018-10-08 | Discharge: 2018-10-08 | Disposition: A | Discharge status: Home / Self Care | Payer: Managed Care, Other (non HMO) | Attending: Family Medicine | Admitting: Family Medicine

## 2018-10-08 DIAGNOSIS — S161XXA Strain of muscle, fascia and tendon at neck level, initial encounter: Secondary | ICD-10-CM | POA: Diagnosis not present

## 2018-10-08 MED ORDER — CYCLOBENZAPRINE HCL 10 MG PO TABS: 10.0000 mg | ORAL_TABLET | Freq: Every day | ORAL | 0 refills | Status: AC

## 2018-10-08 NOTE — Discharge Instructions (Addendum)
Motrin or tylenol as needed Heat to area
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
## 2020-05-22 ENCOUNTER — Other Ambulatory Visit: Payer: Self-pay

## 2020-05-22 ENCOUNTER — Ambulatory Visit
Admission: RE | Admit: 2020-05-22 | Discharge: 2020-05-22 | Disposition: A | Discharge status: Home / Self Care | Payer: Managed Care, Other (non HMO) | Source: Ambulatory Visit | Attending: Physician Assistant | Admitting: Physician Assistant

## 2020-05-22 VITALS — BP 128/89 | HR 126 | Temp 98.5°F | Resp 18 | Ht 63.0 in | Wt 120.0 lb

## 2020-05-22 DIAGNOSIS — Z20822 Contact with and (suspected) exposure to covid-19: Secondary | ICD-10-CM | POA: Insufficient documentation

## 2020-05-22 DIAGNOSIS — F17210 Nicotine dependence, cigarettes, uncomplicated: Secondary | ICD-10-CM | POA: Diagnosis not present

## 2020-05-22 DIAGNOSIS — R0981 Nasal congestion: Secondary | ICD-10-CM | POA: Insufficient documentation

## 2020-05-22 DIAGNOSIS — J069 Acute upper respiratory infection, unspecified: Secondary | ICD-10-CM | POA: Diagnosis not present

## 2020-05-22 DIAGNOSIS — Z79899 Other long term (current) drug therapy: Secondary | ICD-10-CM | POA: Insufficient documentation

## 2020-05-22 DIAGNOSIS — Z7951 Long term (current) use of inhaled steroids: Secondary | ICD-10-CM | POA: Diagnosis not present

## 2020-05-22 DIAGNOSIS — Z793 Long term (current) use of hormonal contraceptives: Secondary | ICD-10-CM | POA: Insufficient documentation

## 2020-05-22 DIAGNOSIS — R05 Cough: Secondary | ICD-10-CM

## 2020-05-22 DIAGNOSIS — F419 Anxiety disorder, unspecified: Secondary | ICD-10-CM | POA: Insufficient documentation

## 2020-05-22 DIAGNOSIS — R059 Cough, unspecified: Secondary | ICD-10-CM

## 2020-05-22 DIAGNOSIS — I10 Essential (primary) hypertension: Secondary | ICD-10-CM | POA: Diagnosis not present

## 2020-05-22 NOTE — ED Provider Notes (Signed)
[facility]    CSN: [id number] Arrival date & time: 05/22/20  1151      History   Chief Complaint Chief Complaint  Patient presents with  . Appointment  . Nasal Congestion  . Cough    HPI Belinda Nelson is a 44 y.o. female.   Patient presents for 2-day history of nasal congestion and cough.  She states she has been taking over-the-counter Dimetapp for cough.  Patient has not been vaccinated for Covid.  She denies any known Covid exposure.  Patient states she took an at home Covid test that was negative yesterday.  Patient says that her son has been sick with similar symptoms for the past week and seems to be improving.  Patient does admit that the over-the-counter cough medicine, aspirin, vitamins, and warm steam from the shower seem to be helping her symptoms.  She states that she is concerned since her mucus is yellow in coloration.  She denies any fevers, chest pain, breathing difficulty, weakness, sinus pain, abdominal pain, nausea, vomiting, diarrhea.  She is otherwise healthy with history of anxiety and hypertension.  She admits to being under a lot of stress recently at home and says that has contributed to her fatigue.  No other concerns today.  HPI  Past Medical History:  Diagnosis Date  . Anxiety   . Hypertension     There are no problems to display for this patient.   Past Surgical History:  Procedure Laterality Date  . NO PAST SURGERIES      OB History   No obstetric history on file.      Home Medications    Prior to Admission medications   Medication Sig Start Date End Date Taking? Authorizing Provider  busPIRone (BUSPAR) 10 MG tablet Take 10 mg by mouth 2 (two) times daily as needed. 05/14/20  Yes [provider]  escitalopram (LEXAPRO) 10 MG tablet Take 10 mg by mouth daily.   Yes [provider]  lisinopril (PRINIVIL,ZESTRIL) 10 MG tablet Take 10 mg by mouth daily.   Yes [provider]  norethindrone-ethinyl  estradiol-iron (ESTROSTEP FE,TILIA FE,TRI-LEGEST FE) 1-20/1-30/1-35 MG-MCG tablet Take 1 tablet by mouth daily.   Yes [provider]  pantoprazole (PROTONIX) 20 MG tablet Take 20 mg by mouth daily.   Yes [provider]  rosuvastatin (CRESTOR) 5 MG tablet Take by mouth. 07/30/18  Yes [provider]  cyclobenzaprine (FLEXERIL) 10 MG tablet Take 1 tablet (10 mg total) by mouth at bedtime. 10/08/18   Payton Mccallum, MD  mometasone (NASONEX) 50 MCG/ACT nasal spray Place into the nose. 11/27/17 11/27/18  [provider]  ondansetron (ZOFRAN ODT) 8 MG disintegrating tablet Take 1 tablet (8 mg total) by mouth 2 (two) times daily. 10/06/17   Lutricia Feil, PA-C    Family History Family History  Problem Relation Age of Onset  . Hypertension Mother   . Alcoholism Father   . Hypertension Father     Social History Social History   Tobacco Use  . Smoking status: Current Every Day Smoker    Packs/day: 1.00    Years: 20.00    Pack years: 20.00    Types: Cigarettes  . Smokeless tobacco: Never Used  Vaping Use  . Vaping Use: Never used  Substance Use Topics  . Alcohol use: Not Currently  . Drug use: No     Allergies   Ciprofloxacin, Duloxetine, and Sertraline   Review of Systems Review of Systems  Constitutional: Positive for  fatigue. Negative for chills, diaphoresis and fever.  HENT: Positive for congestion, postnasal drip and rhinorrhea. Negative for ear pain, sinus pressure, sinus pain and sore throat.   Respiratory: Positive for cough. Negative for shortness of breath.   Gastrointestinal: Negative for abdominal pain, nausea and vomiting.  Musculoskeletal: Negative for arthralgias and myalgias.  Skin: Negative for rash.  Neurological: Negative for weakness and headaches.  Hematological: Negative for adenopathy.  Psychiatric/Behavioral: The patient is nervous/anxious.      Physical Exam Triage Vital Signs ED Triage Vitals [05/22/20 1300]    Enc Vitals Group     BP 128/89     Pulse Rate (!) 126     Resp 18     Temp 98.5 F (36.9 C)     Temp Source Oral     SpO2 98 %     Weight      Height      Head Circumference      Peak Flow      Pain Score 0     Pain Loc      Pain Edu?      Excl. in GC?    No data found.  Updated Vital Signs BP 128/89 (BP Location: Left Arm)   Pulse (!) 126   Temp 98.5 F (36.9 C) (Oral)   Resp 18   Ht 5\' 3"  (1.6 m)   Wt 120 lb (54.4 kg)   LMP 05/06/2020 (Exact Date)   SpO2 98%   BMI 21.26 kg/m       Physical Exam Vitals and nursing note reviewed.  Constitutional:      General: She is not in acute distress.    Appearance: Normal appearance. She is not ill-appearing or toxic-appearing.  HENT:     Head: Normocephalic and atraumatic.     Nose: Congestion present.     Mouth/Throat:     Mouth: Mucous membranes are moist.     Pharynx: Oropharynx is clear.  Eyes:     General: No scleral icterus.       Right eye: No discharge.        Left eye: No discharge.     Conjunctiva/sclera: Conjunctivae normal.  Cardiovascular:     Rate and Rhythm: Regular rhythm. Tachycardia present.     Heart sounds: Normal heart sounds.  Pulmonary:     Effort: Pulmonary effort is normal. No respiratory distress.     Breath sounds: Normal breath sounds.  Musculoskeletal:     Cervical back: Neck supple.  Skin:    General: Skin is dry.  Neurological:     General: No focal deficit present.     Mental Status: She is alert. Mental status is at baseline.     Motor: No weakness.     Gait: Gait normal.  Psychiatric:        Mood and Affect: Mood is anxious.        Behavior: Behavior normal.        Thought Content: Thought content normal.      UC Treatments / Results  Labs (all labs ordered are listed, but only abnormal results are displayed) Labs Reviewed  SARS CORONAVIRUS 2 (TAT 6-24 HRS)    EKG   Radiology No results found.  Procedures Procedures (including critical care  time)  Medications Ordered in UC Medications - No data to display  Initial Impression / Assessment and Plan / UC Course  I have reviewed the triage vital signs and the nursing notes.  Pertinent labs &  imaging results that were available during my care of the patient were reviewed by me and considered in my medical decision making (see chart for details).  44 year old healthy female presenting for cough and congestion x2 days.  I spoke with patient about the mucus and explained that the color of mucus is not really indicative of bacterial infection.  Advised her that this is likely a viral infection probably the same one that her son has.  Advised her that most people get better in 7 to 10 days with rest and over-the-counter medicines.  All of her vital signs are normal other than her heart rate being elevated, patient has a history of anxiety and admits to being anxious.  Her chest is clear to auscultation.  She is not ill-appearing.   Covid testing obtained.  Advised patient how to access result.  Discussed CDC guidelines, isolation protocol, and ED precautions for Covid.  Advised to rest increase fluid intake.  Continue over-the-counter cough medicine.  Advised to follow-up with our clinic for any new or worsening symptoms.  Final Clinical Impressions(s) / UC Diagnoses   Final diagnoses:  Viral upper respiratory tract infection  Nasal congestion  Cough     Discharge Instructions     URI/COLD SYMPTOMS: Your exam today is consistent with a viral illness. Antibiotics are not indicated at this time. Use medications as directed, including cough syrup, nasal saline, and decongestants. Your symptoms should improve over the next few days and resolve within 7-10 days. Increase rest and fluids. F/u if symptoms worsen or predominate such as sore throat, ear pain, productive cough, shortness of breath, or if you develop high fevers or worsening fatigue over the next several days.    You have  received COVID testing today either for positive exposure, concerning symptoms that could be related to COVID infection, screening purposes, or re-testing after confirmed positive.  Your test obtained today checks for active viral infection in the last 1-2 weeks. If your test is negative now, you can still test positive later. So, if you do develop symptoms you should either get re-tested and/or isolate x 10 days. Please follow CDC guidelines.  While Rapid antigen tests come back in 15-20 minutes, send out PCR/molecular test results typically come back within 24 hours. In the mean time, if you are symptomatic, assume this could be a positive test and treat/monitor yourself as if you do have COVID.   We will call with test results. Please download the MyChart app and set up a profile to access test results.   If symptomatic, go home and rest. Push fluids. Take Tylenol as needed for discomfort. Gargle warm salt water. Throat lozenges. Take Mucinex DM or Robitussin for cough. Humidifier in bedroom to ease coughing. Warm showers. Also review the COVID handout for more information.  COVID-19 INFECTION: The incubation period of COVID-19 is approximately 14 days after exposure, with most symptoms developing in roughly 4-5 days. Symptoms may range in severity from mild to critically severe. Roughly 80% of those infected will have mild symptoms. People of any age may become infected with COVID-19 and have the ability to transmit the virus. The most common symptoms include: fever, fatigue, cough, body aches, headaches, sore throat, nasal congestion, shortness of breath, nausea, vomiting, diarrhea, changes in smell and/or taste.    COURSE OF ILLNESS Some patients may begin with mild disease which can progress quickly into critical symptoms. If your symptoms are worsening please call ahead to the Emergency Department and proceed there for  further treatment. Recovery time appears to be roughly 1-2 weeks for mild  symptoms and 3-6 weeks for severe disease.   GO IMMEDIATELY TO ER FOR FEVER YOU ARE UNABLE TO GET DOWN WITH TYLENOL, BREATHING PROBLEMS, CHEST PAIN, FATIGUE, LETHARGY, INABILITY TO EAT OR DRINK, ETC  QUARANTINE AND ISOLATION: To help decrease the spread of COVID-19 please remain isolated if you have COVID infection or are highly suspected to have COVID infection. This means -stay home and isolate to one room in the home if you live with others. Do not share a bed or bathroom with others while ill, sanitize and wipe down all countertops and keep common areas clean and disinfected. You may discontinue isolation if you have a mild case and are asymptomatic 10 days after symptom onset as long as you have been fever free >24 hours without having to take Motrin or Tylenol. If your case is more severe (meaning you develop pneumonia or are admitted in the hospital), you may have to isolate longer.   If you have been in close contact (within 6 feet) of someone diagnosed with COVID 19, you are advised to quarantine in your home for 14 days as symptoms can develop anywhere from 2-14 days after exposure to the virus. If you develop symptoms, you  must isolate.  Most current guidelines for COVID after exposure -isolate 10 days if you ARE NOT tested for COVID as long as symptoms do not develop -isolate 7 days if you are tested and remain asymptomatic -You do not necessarily need to be tested for COVID if you have + exposure and        develop   symptoms. Just isolate at home x10 days from symptom onset During this global pandemic, CDC advises to practice social distancing, try to stay at least 26ft away from others at all times. Wear a face covering. Wash and sanitize your hands regularly and avoid going anywhere that is not necessary.  KEEP IN MIND THAT THE COVID TEST IS NOT 100% ACCURATE AND YOU SHOULD STILL DO EVERYTHING TO PREVENT POTENTIAL SPREAD OF VIRUS TO OTHERS (WEAR MASK, WEAR GLOVES, WASH HANDS AND SANITIZE  REGULARLY). IF INITIAL TEST IS NEGATIVE, THIS MAY NOT MEAN YOU ARE DEFINITELY NEGATIVE. MOST ACCURATE TESTING IS DONE 5-7 DAYS AFTER EXPOSURE.   It is not advised by CDC to get re-tested after receiving a positive COVID test since you can still test positive for weeks to months after you have already cleared the virus.   *If you have not been vaccinated for COVID, I strongly suggest you consider getting vaccinated as long as there are no contraindications.      ED Prescriptions    None     PDMP not reviewed this encounter.   Shirlee Latch, PA-C 05/22/20 1321

## 2020-05-22 NOTE — Discharge Instructions (Addendum)

## 2020-05-22 NOTE — ED Triage Notes (Signed)
Patient in today c/o nasal congestion and cough x 2 days. Patient denies fever. Patient took an at home covid test yesterday and it was negative. Patient has not had the covid vaccine. Patient took Dimetapp last night.

## 2020-05-23 LAB — SARS CORONAVIRUS 2 (TAT 6-24 HRS): SARS Coronavirus 2: NEGATIVE

## 2020-05-25 ENCOUNTER — Ambulatory Visit: Payer: Managed Care, Other (non HMO)

## 2020-05-25 ENCOUNTER — Ambulatory Visit
Admission: EM | Admit: 2020-05-25 | Discharge: 2020-05-25 | Disposition: A | Discharge status: Home / Self Care | Payer: Managed Care, Other (non HMO) | Attending: Family Medicine | Admitting: Family Medicine

## 2020-05-25 ENCOUNTER — Ambulatory Visit (INDEPENDENT_AMBULATORY_CARE_PROVIDER_SITE_OTHER): Payer: Managed Care, Other (non HMO)

## 2020-05-25 ENCOUNTER — Other Ambulatory Visit: Payer: Self-pay

## 2020-05-25 DIAGNOSIS — R05 Cough: Secondary | ICD-10-CM

## 2020-05-25 DIAGNOSIS — R0602 Shortness of breath: Secondary | ICD-10-CM | POA: Diagnosis not present

## 2020-05-25 DIAGNOSIS — J209 Acute bronchitis, unspecified: Secondary | ICD-10-CM

## 2020-05-25 DIAGNOSIS — R059 Cough, unspecified: Secondary | ICD-10-CM

## 2020-05-25 MED ORDER — ALBUTEROL SULFATE HFA 108 (90 BASE) MCG/ACT IN AERS: 1.0000 | INHALATION_SPRAY | RESPIRATORY_TRACT | 0 refills | Status: AC | PRN

## 2020-05-25 MED ORDER — AZITHROMYCIN 250 MG PO TABS: 250.0000 mg | ORAL_TABLET | Freq: Every day | ORAL | 0 refills | Status: AC

## 2020-05-25 NOTE — Discharge Instructions (Signed)
BRONCHITIS: Take Mucinex D throughout the day and drink plenty of fluids to break up the mucus . Take cough syrup at bedtime if needed, for cough and to assist with sleep. Take Ibuprofen or other NSAID for relief of any pleuritic pain. Increase rest and fluid intake. Return to the clinic, your PCP, or ER if you have any new/ worsening symptoms such as fever, chest pain, difficulty breathing, worsening cough, mental status changes, lethargy, etc.  °

## 2020-05-25 NOTE — ED Triage Notes (Signed)
Patient reports she was seen here Friday. States that over the weekend, the congestion in her chest continued to worsen and now patient reports dyspnea on exertion as well at rest.   COVID swab from Friday came back negative.

## 2020-05-25 NOTE — ED Provider Notes (Signed)
[facility]    CSN: [id number] Arrival date & time: 05/25/20  1042      History   Chief Complaint Chief Complaint  Patient presents with  . Shortness of Breath    HPI Belinda Nelson is a 44 y.o. female.   Patient returns to [facility] urgent care for continued symptoms.  She was seen in the clinic 3 days ago for cold symptoms.  She says that over the last 3 days she has had increased chest congestion and is now having some shortness of breath.  Patient had a negative Covid test 3 days ago.  She has been ill for 5 days now.  Patient says she is continue to take decongestants and her symptoms of gotten a lot worse.  She states that she is under a lot of stress and her anxiety that she already has is gotten worse.  She says it is hard to tell if her shortness of breath is due to anxiety or due to something else.  She takes Buspar for anxiety.  She was recently started on Lexapro a week ago.  She will wean off of the Buspar soon.  She says that she sees a therapist as well.  Patient denies any pain in her chest or weakness.  She denies fevers.  She says her cough was initially productive but now is not.  She denies any vomiting or diarrhea.  She continues to deny any history of cardiopulmonary disease.  No other concerns today.  HPI  Past Medical History:  Diagnosis Date  . Anxiety   . Hypertension     There are no problems to display for this patient.   Past Surgical History:  Procedure Laterality Date  . NO PAST SURGERIES      OB History   No obstetric history on file.      Home Medications    Prior to Admission medications   Medication Sig Start Date End Date Taking? Authorizing Provider  albuterol (VENTOLIN HFA) 108 (90 Base) MCG/ACT inhaler Inhale 1-2 puffs into the lungs every 4 (four) hours as needed for up to 7 days for wheezing or shortness of breath. 05/25/20 06/01/20  Eusebio Friendly B, PA-C  azithromycin (ZITHROMAX) 250 MG tablet Take 1 tablet (250 mg total)  by mouth daily. Take first 2 tablets together, then 1 every day until finished. 05/25/20   Shirlee Latch, PA-C  busPIRone (BUSPAR) 10 MG tablet Take 10 mg by mouth 2 (two) times daily as needed. 05/14/20   [provider]  cyclobenzaprine (FLEXERIL) 10 MG tablet Take 1 tablet (10 mg total) by mouth at bedtime. 10/08/18   Payton Mccallum, MD  escitalopram (LEXAPRO) 10 MG tablet Take 10 mg by mouth daily.    [provider]  lisinopril (PRINIVIL,ZESTRIL) 10 MG tablet Take 10 mg by mouth daily.    [provider]  mometasone (NASONEX) 50 MCG/ACT nasal spray Place into the nose. 11/27/17 11/27/18  [provider]  norethindrone-ethinyl estradiol-iron (ESTROSTEP FE,TILIA FE,TRI-LEGEST FE) 1-20/1-30/1-35 MG-MCG tablet Take 1 tablet by mouth daily.    [provider]  ondansetron (ZOFRAN ODT) 8 MG disintegrating tablet Take 1 tablet (8 mg total) by mouth 2 (two) times daily. 10/06/17   Lutricia Feil, PA-C  pantoprazole (PROTONIX) 20 MG tablet Take 20 mg by mouth daily.    [provider]  rosuvastatin (CRESTOR) 5 MG tablet Take by mouth. 07/30/18   [provider]    Family History Family History  Problem  Relation Age of Onset  . Hypertension Mother   . Alcoholism Father   . Hypertension Father     Social History Social History   Tobacco Use  . Smoking status: Current Every Day Smoker    Packs/day: 1.00    Years: 20.00    Pack years: 20.00    Types: Cigarettes  . Smokeless tobacco: Never Used  Vaping Use  . Vaping Use: Never used  Substance Use Topics  . Alcohol use: Not Currently  . Drug use: No     Allergies   Ciprofloxacin, Duloxetine, and Sertraline   Review of Systems Review of Systems  Constitutional: Positive for fatigue. Negative for chills, diaphoresis and fever.  HENT: Positive for congestion. Negative for ear pain, rhinorrhea, sinus pressure, sinus pain and sore throat.   Respiratory: Positive for cough and  shortness of breath.   Gastrointestinal: Negative for abdominal pain, nausea and vomiting.  Musculoskeletal: Negative for arthralgias and myalgias.  Skin: Negative for rash.  Neurological: Negative for weakness and headaches.  Hematological: Negative for adenopathy.     Physical Exam Triage Vital Signs ED Triage Vitals  Enc Vitals Group     BP 05/25/20 1059 130/90     Pulse Rate 05/25/20 1059 (!) 111     Resp 05/25/20 1059 20     Temp 05/25/20 1059 98.5 F (36.9 C)     Temp src --      SpO2 05/25/20 1059 97 %     Weight --      Height --      Head Circumference --      Peak Flow --      Pain Score 05/25/20 1056 2     Pain Loc --      Pain Edu? --      Excl. in GC? --    No data found.  Updated Vital Signs BP 130/90   Pulse (!) 111   Temp 98.5 F (36.9 C)   Resp 20   LMP 05/06/2020 (Exact Date)   SpO2 97%   Physical Exam Vitals and nursing note reviewed.  Constitutional:      General: She is not in acute distress.    Appearance: Normal appearance. She is ill-appearing. She is not toxic-appearing.  HENT:     Head: Normocephalic and atraumatic.     Nose: Congestion present.     Mouth/Throat:     Mouth: Mucous membranes are moist.     Pharynx: Oropharynx is clear.  Eyes:     General: No scleral icterus.       Right eye: No discharge.        Left eye: No discharge.     Conjunctiva/sclera: Conjunctivae normal.  Cardiovascular:     Rate and Rhythm: Regular rhythm. Tachycardia present.     Heart sounds: Normal heart sounds.  Pulmonary:     Effort: Pulmonary effort is normal. No respiratory distress.     Breath sounds: Examination of the right-middle field reveals wheezing. Examination of the left-middle field reveals wheezing. Examination of the right-lower field reveals wheezing. Examination of the left-lower field reveals wheezing. Wheezing present. No rhonchi or rales.  Musculoskeletal:     Cervical back: Neck supple.  Skin:    General: Skin is dry.    Neurological:     General: No focal deficit present.     Mental Status: She is alert. Mental status is at baseline.     Motor: No weakness.     Gait: Gait  normal.  Psychiatric:        Mood and Affect: Mood normal.        Behavior: Behavior normal.        Thought Content: Thought content normal.      UC Treatments / Results  Labs (all labs ordered are listed, but only abnormal results are displayed) Labs Reviewed - No data to display  EKG   Radiology DG Chest 2 View  Result Date: 05/25/2020 CLINICAL DATA:  Shortness of breath. EXAM: CHEST - 2 VIEW COMPARISON:  CT chest dated June 06, 2013. FINDINGS: The heart size and mediastinal contours are within normal limits. Normal pulmonary vascularity. The lungs are hyperinflated. No focal consolidation, pleural effusion, or pneumothorax. No acute osseous abnormality. IMPRESSION: 1. No acute cardiopulmonary disease. Electronically Signed   By: Obie Dredge M.D.   On: 05/25/2020 11:56    Procedures Procedures (including critical care time)  Medications Ordered in UC Medications - No data to display  Initial Impression / Assessment and Plan / UC Course  I have reviewed the triage vital signs and the nursing notes.  Pertinent labs & imaging results that were available during my care of the patient were reviewed by me and considered in my medical decision making (see chart for details).   Patient returns for worsening symptoms.  She does have wheezing throughout her lungs mildly.  Oxygen is 97%.  Heart rate elevated at 111, patient is anxious and it was also elevated the other day. CXR normal. Wells Score 1.5, patient in low risk group for PE.  Patient symptoms consistent with bronchitis.  Since she is feeling so much worse so quickly, treating for potential bacterial cause with with azithromycin.  Also prescribed inhaler for shortness of breath.  Advised her to follow-up with [place] if she develops fever, worsening cough, chest pain or  increased breathing difficulty.  For any acute worsening of symptoms she should go to the emergency room.  Patient agreeable.   Final Clinical Impressions(s) / UC Diagnoses   Final diagnoses:  Acute bronchitis, unspecified organism  Cough  Shortness of breath     Discharge Instructions     BRONCHITIS: Take Mucinex D throughout the day and drink plenty of fluids to break up the mucus . Take cough syrup at bedtime if needed, for cough and to assist with sleep. Take Ibuprofen or other NSAID for relief of any pleuritic pain. Increase rest and fluid intake. Return to the clinic, your PCP, or ER if you have any new/ worsening symptoms such as fever,  chest pain, difficulty breathing, worsening cough, mental status changes, lethargy, etc.     ED Prescriptions    Medication Sig Dispense Auth. Provider   azithromycin (ZITHROMAX) 250 MG tablet Take 1 tablet (250 mg total) by mouth daily. Take first 2 tablets together, then 1 every day until finished. 6 tablet Eusebio Friendly B, PA-C   albuterol (VENTOLIN HFA) 108 (90 Base) MCG/ACT inhaler Inhale 1-2 puffs into the lungs every 4 (four) hours as needed for up to 7 days for wheezing or shortness of breath. 1 g Shirlee Latch, PA-C     PDMP not reviewed this encounter.   Shirlee Latch, PA-C 05/25/20 1246

## 2021-11-24 ENCOUNTER — Other Ambulatory Visit: Payer: Self-pay | Admitting: Adult Health

## 2021-11-24 DIAGNOSIS — Z1231 Encounter for screening mammogram for malignant neoplasm of breast: Secondary | ICD-10-CM

## 2022-01-26 ENCOUNTER — Ambulatory Visit
Admission: RE | Admit: 2022-01-26 | Discharge: 2022-01-26 | Disposition: A | Discharge status: Home / Self Care | Payer: Managed Care, Other (non HMO) | Source: Ambulatory Visit | Attending: Adult Health | Admitting: Adult Health

## 2022-01-26 DIAGNOSIS — Z1231 Encounter for screening mammogram for malignant neoplasm of breast: Secondary | ICD-10-CM | POA: Insufficient documentation

## 2022-02-04 ENCOUNTER — Other Ambulatory Visit: Payer: Self-pay | Admitting: Adult Health

## 2022-02-04 DIAGNOSIS — N6489 Other specified disorders of breast: Secondary | ICD-10-CM

## 2022-02-04 DIAGNOSIS — R928 Other abnormal and inconclusive findings on diagnostic imaging of breast: Secondary | ICD-10-CM

## 2022-02-08 ENCOUNTER — Other Ambulatory Visit: Payer: Managed Care, Other (non HMO)

## 2022-02-23 ENCOUNTER — Ambulatory Visit
Admission: RE | Admit: 2022-02-23 | Discharge: 2022-02-23 | Disposition: A | Discharge status: Home / Self Care | Payer: Managed Care, Other (non HMO) | Source: Ambulatory Visit | Attending: Adult Health | Admitting: Adult Health

## 2022-02-23 DIAGNOSIS — R928 Other abnormal and inconclusive findings on diagnostic imaging of breast: Secondary | ICD-10-CM | POA: Insufficient documentation

## 2022-02-23 DIAGNOSIS — N6489 Other specified disorders of breast: Secondary | ICD-10-CM

## 2022-07-25 IMAGING — MG MM DIGITAL DIAGNOSTIC UNILAT*L* W/ TOMO W/ CAD
6 series · 6 of 18 positions shown · non-contrast
Comparison: Previous exam(s).

CLINICAL DATA: Callback for 2 LEFT breast asymmetries from baseline
mammogram.

EXAM:
DIGITAL DIAGNOSTIC UNILATERAL LEFT MAMMOGRAM WITH TOMOSYNTHESIS AND
CAD; ULTRASOUND LEFT BREAST LIMITED
TECHNIQUE: Left digital diagnostic mammography and breast tomosynthesis was
performed. The images were evaluated with computer-aided detection.;
Targeted ultrasound examination of the left breast was performed.

[L CC synth-2D (1 of 2)]
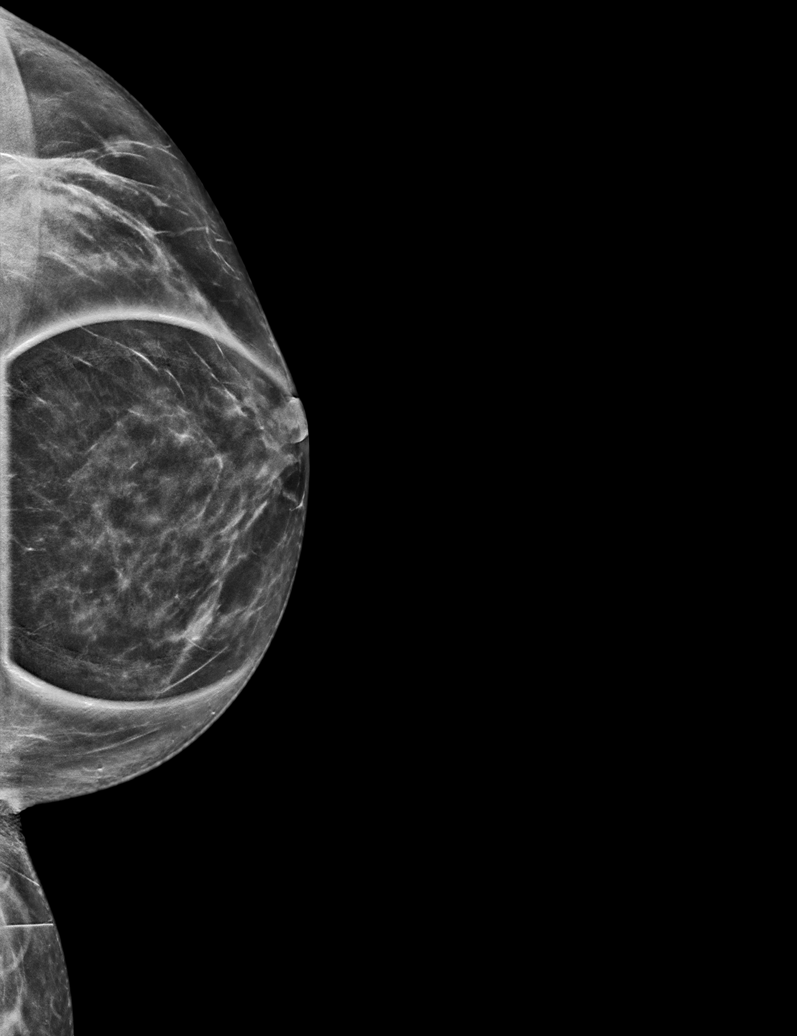

[L ML synth-2D]
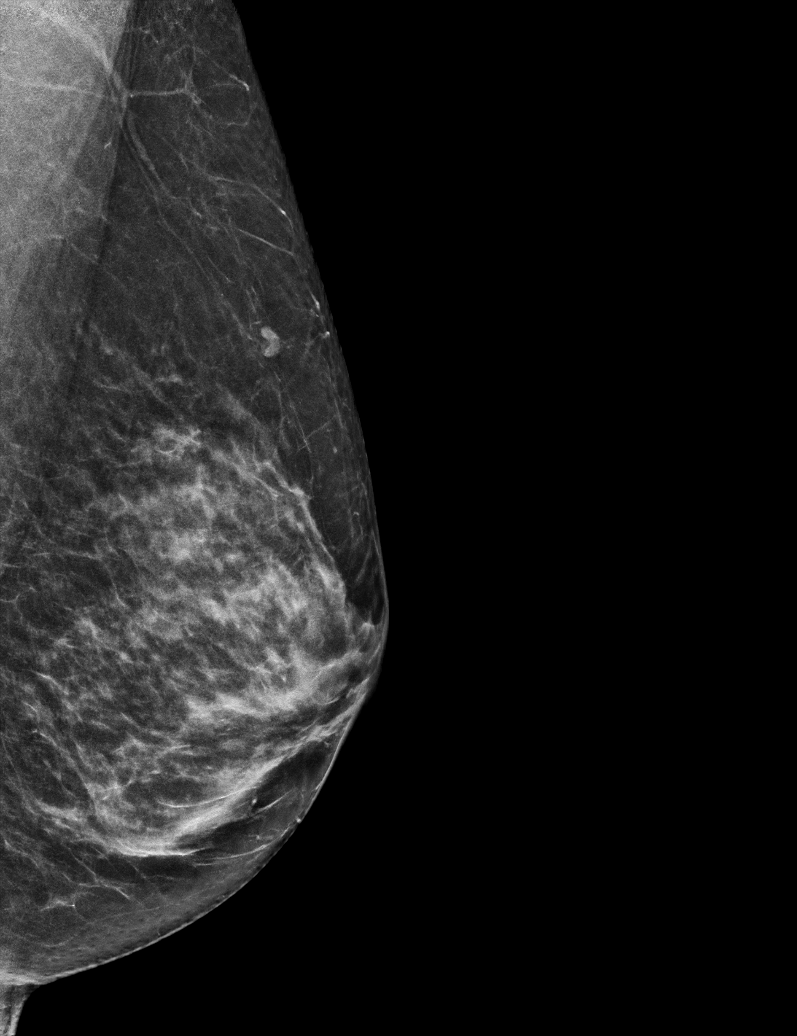

[L CC synth-2D (2 of 2)]
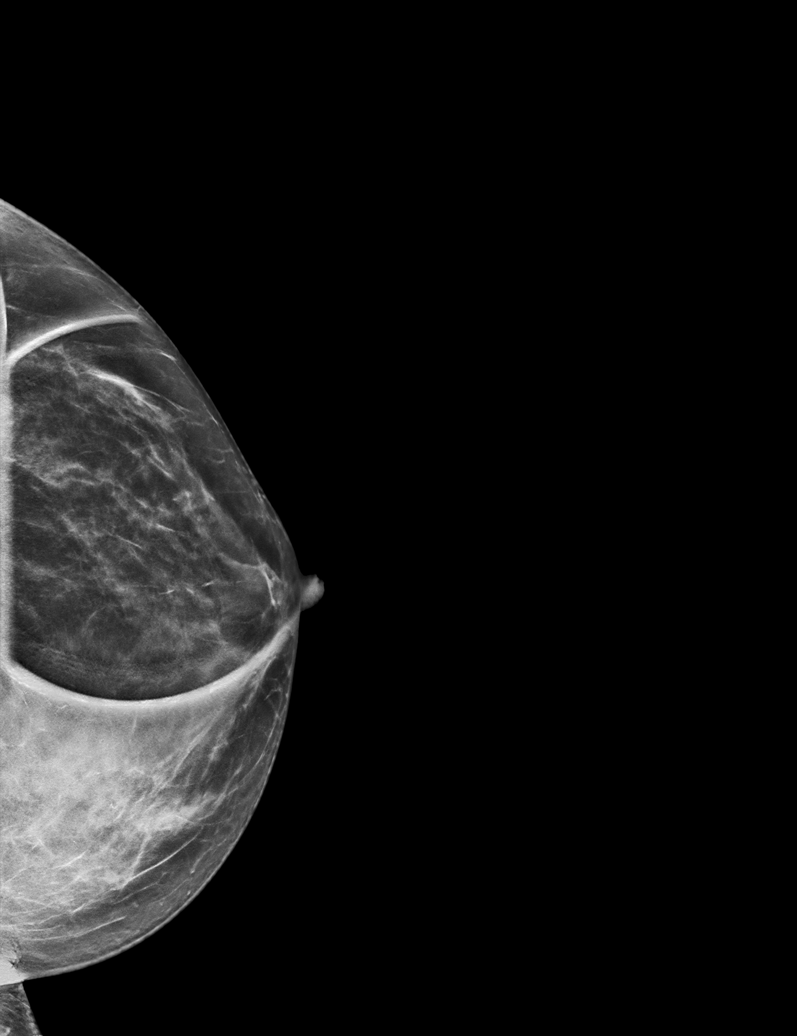

[L CC tomo (1 of 2) · tomo slice 28/55.0]
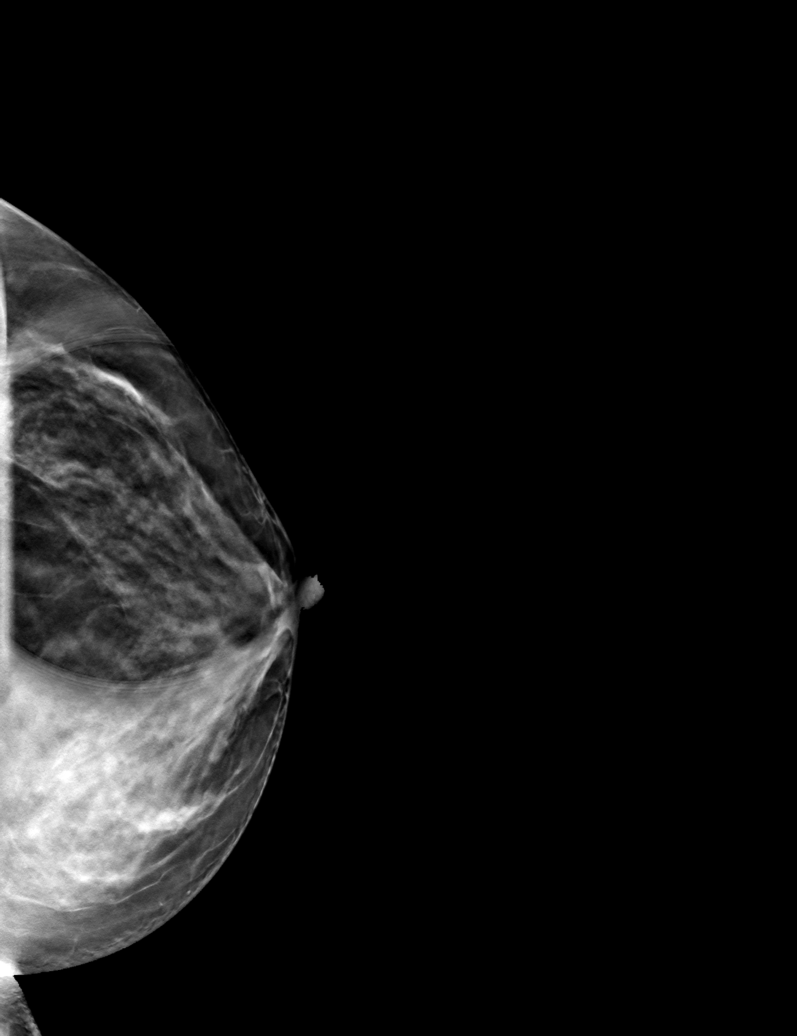

[L ML tomo · tomo slice 29/56.0]
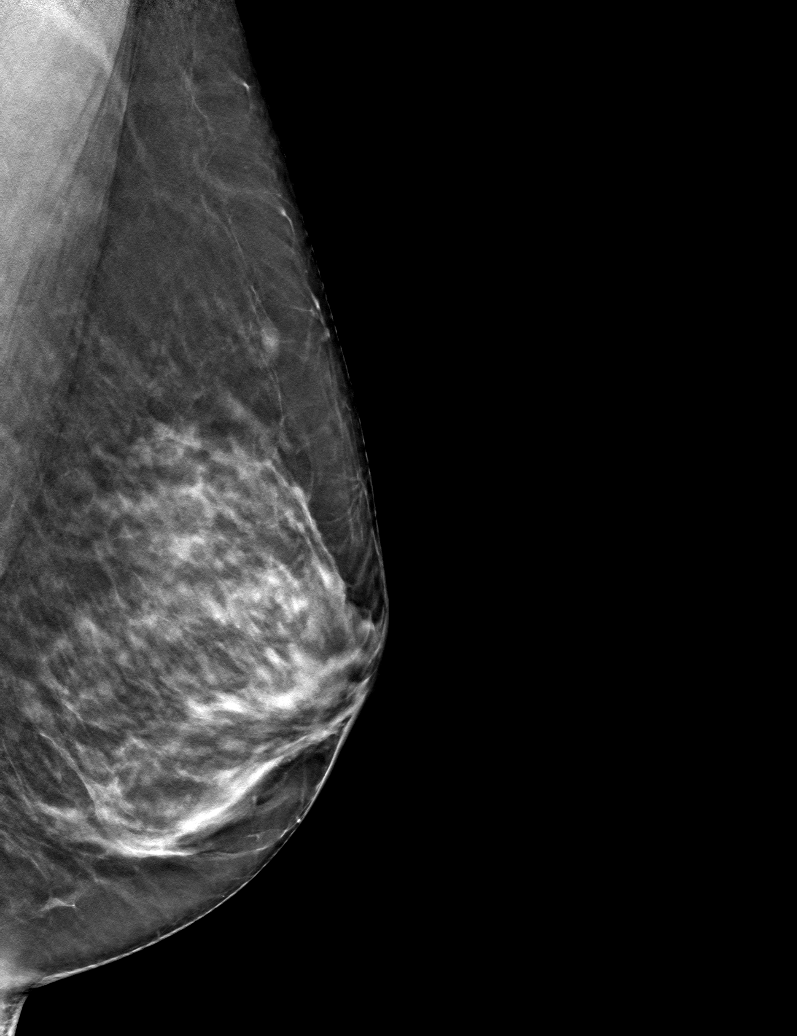

[L CC tomo (2 of 2) · tomo slice 31/61.0]
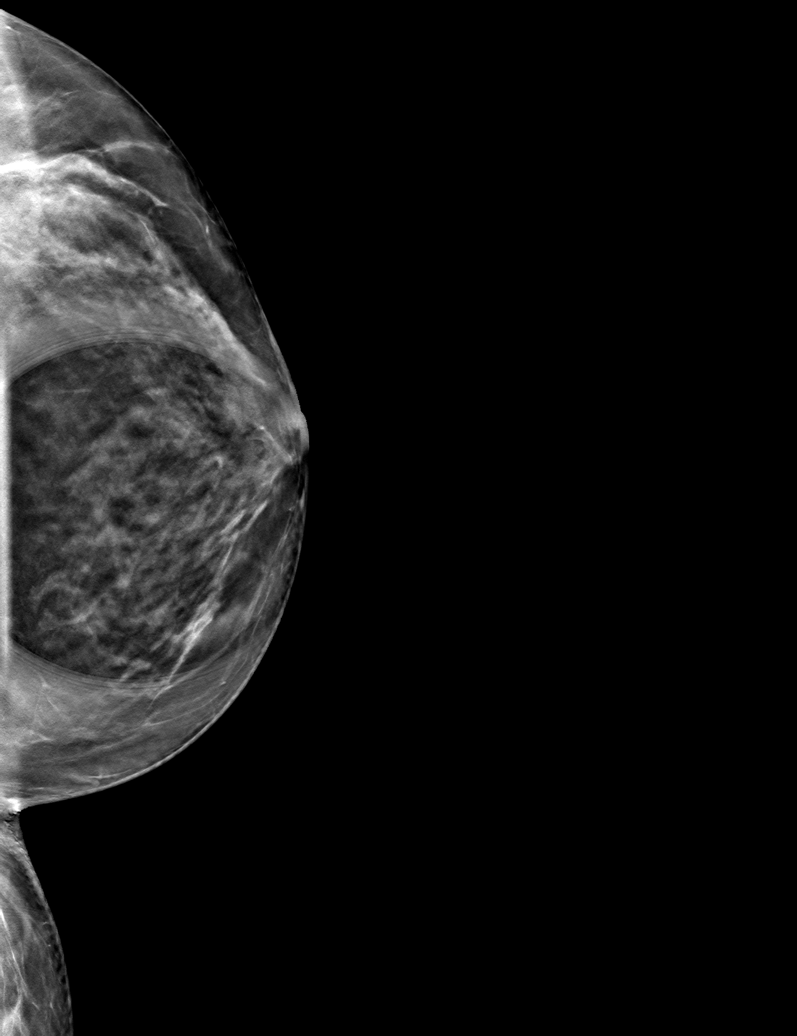

[6 of 18 positions shown; findings below may reference images not displayed]

ACR Breast Density Category c: The breast tissue is heterogeneously
dense, which may obscure small masses.
FINDINGS: Previously described finding in the inner LEFT breast resolves with
additional views, consistent with overlapping tissue. Spot
compression tomosynthesis views confirm persistence of several
adjacent oval circumscribed masses in the LEFT lower outer breast at
anterior depth. These are superficial in location and demonstrate a
suggestion of internal fat density. They may reflect intramammary
lymph nodes.

On physical exam, no suspicious mass is appreciated.

Targeted ultrasound was performed of the LEFT lower outer breast.
There are multiple adjacent oval near anechoic masses with internal
echogenic hila and hilar blood flow noted in the breast at 5 o'clock
2 cm from the nipple. This is consistent with a cluster of benign
intramammary lymph nodes. No suspicious cystic or solid mass is
seen.
IMPRESSION: No mammographic or sonographic evidence of malignancy at the sites
of screening mammographic concern in the LEFT breast.

RECOMMENDATION:
Screening mammogram in one year.(Code:ZO-7-P8I)

I have discussed the findings and recommendations with the patient.
If applicable, a reminder letter will be sent to the patient
regarding the next appointment.

BI-RADS CATEGORY  2: Benign.
